# Patient Record
Sex: Male | Born: 1995 | Race: White | Hispanic: No | Marital: Single | Smoking: Never smoker
Health system: Southern US, Community
[De-identification: ages and names within clinical notes are randomized; demographics above are authoritative.]

## PROBLEM LIST (undated history)

## (undated) HISTORY — PX: KNEE SURGERY: SHX244

---
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
## 2014-04-12 IMAGING — CT CT ABD-PELV W/ CM
2 of 4 series · 14 of 32 positions shown, 19 images · IV contrast (READICAT/WATER & [ID] OMNI 300)
Comparison: None.

CLINICAL DATA: Evaluate for appendicitis.  Abdominal pain.

CT ABDOMEN AND PELVIS WITH CONTRAST
TECHNIQUE: Multidetector CT imaging of the abdomen and pelvis was
performed following the standard protocol during bolus
administration of intravenous contrast.
Contrast: 100mL OMNIPAQUE IOHEXOL 300 MG/ML  SOLN

[Series 2: abd/pelvis with · axial · 0.62mm/px · z∈[-404,-99]mm · 7 of 83 slices shown, 12 images]
[im 11/83  soft-tissue]
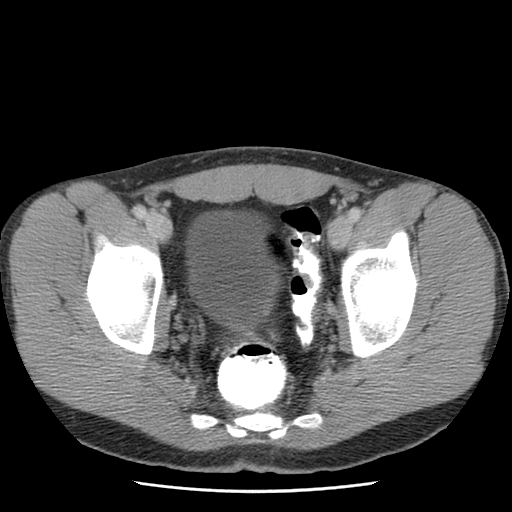
[im 11/83  bone]
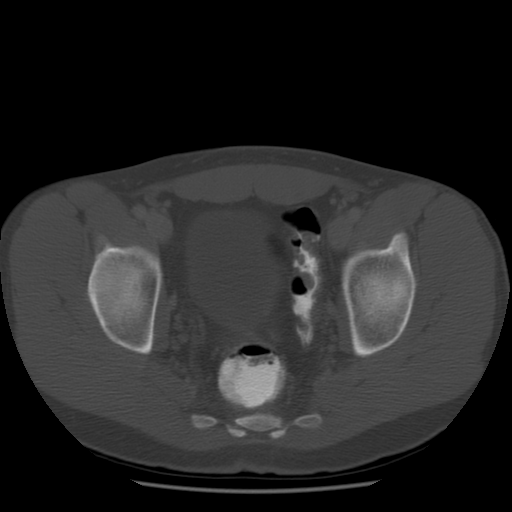
[im 21/83  soft-tissue]
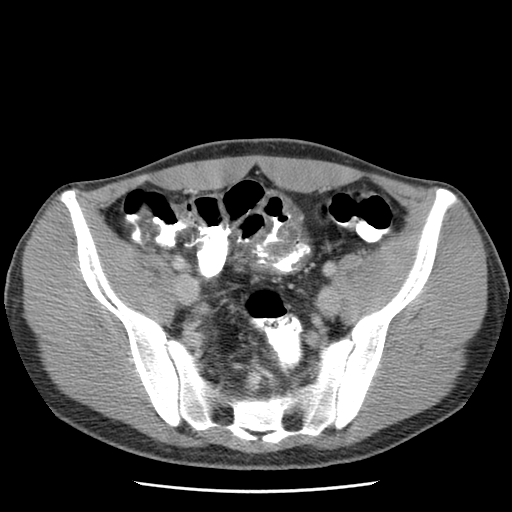
[im 31/83  soft-tissue]
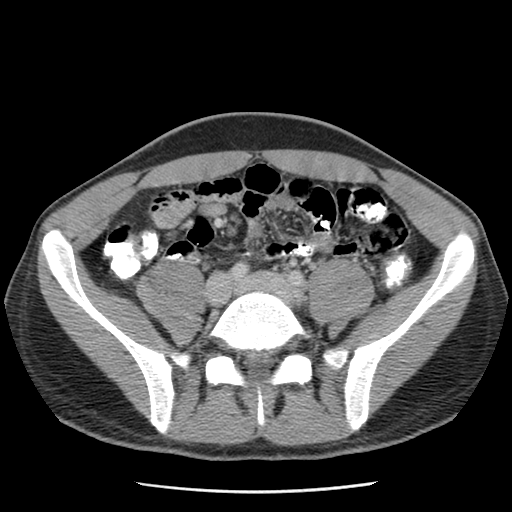
[im 42/83  soft-tissue]
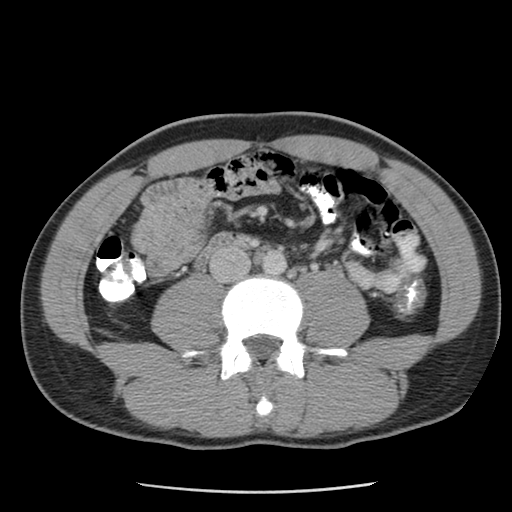
[im 42/83  lung]
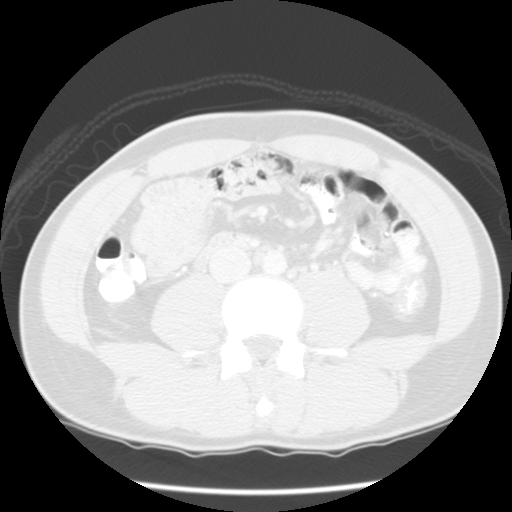
[im 52/83  soft-tissue]
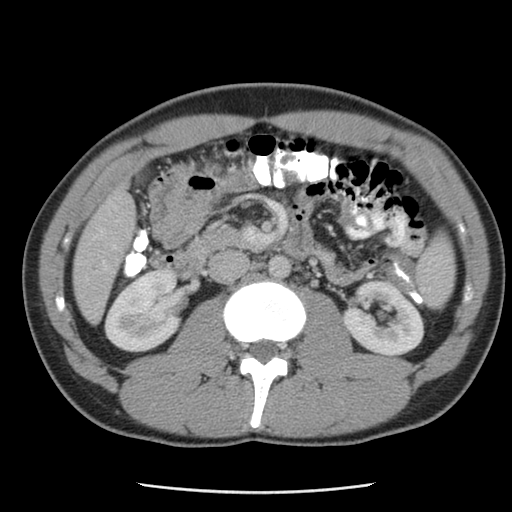
[im 52/83  lung]
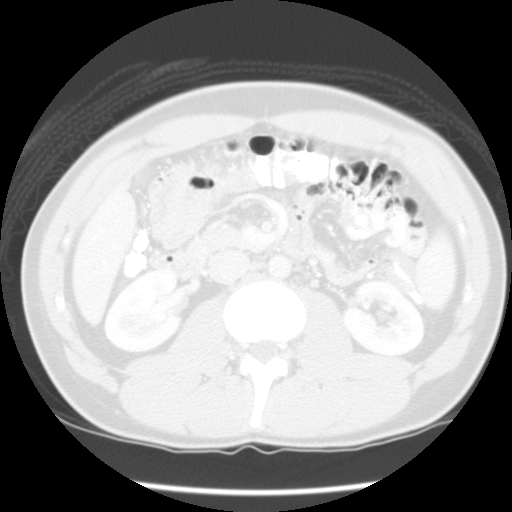
[im 62/83  soft-tissue]
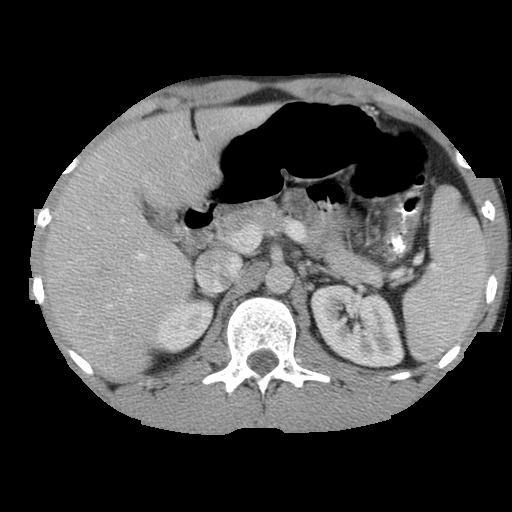
[im 62/83  lung]
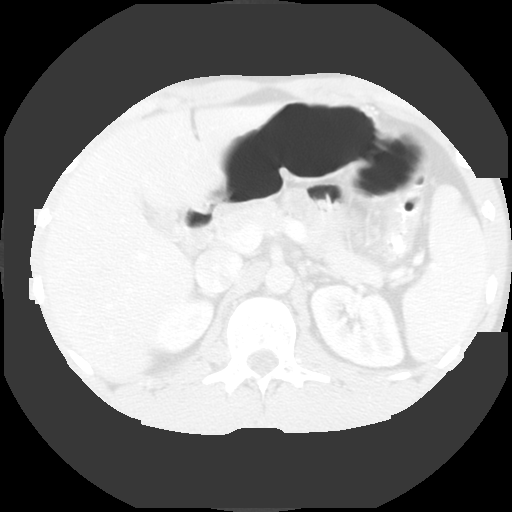
[im 72/83  soft-tissue]
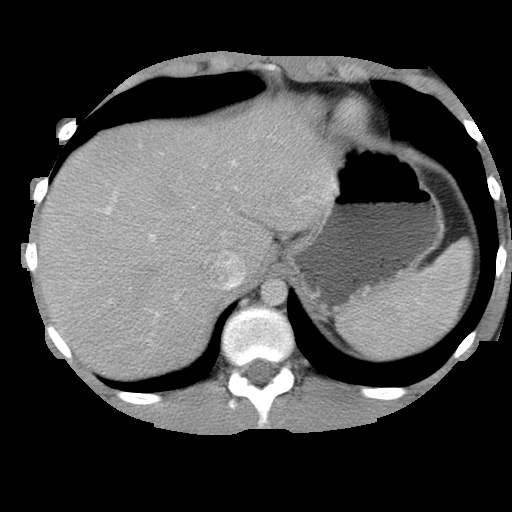
[im 72/83  lung]
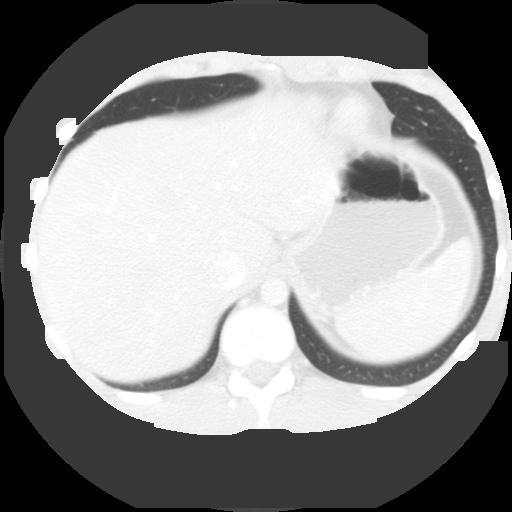

[Series 400: sag · sagittal · 0.86mm/px · 7 of 124 slices shown]
[im 11/124  soft-tissue]
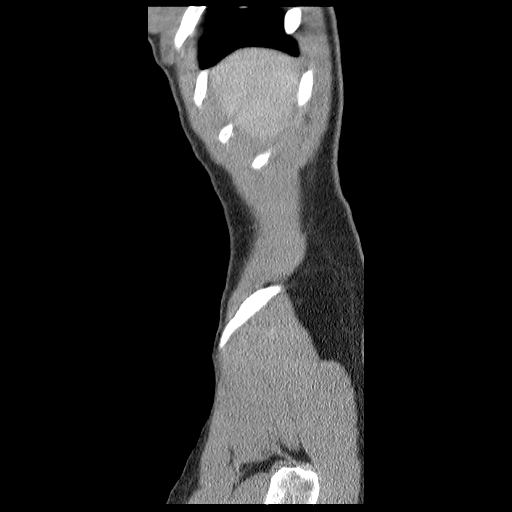
[im 31/124  soft-tissue]
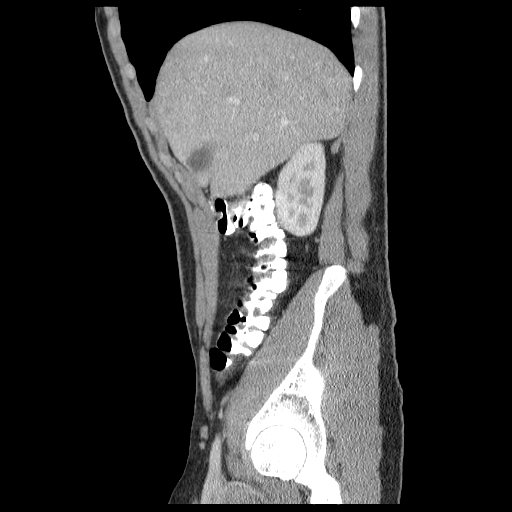
[im 42/124  soft-tissue]
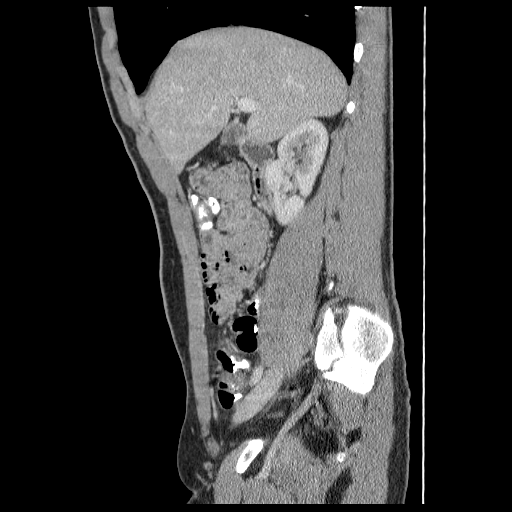
[im 52/124  soft-tissue]
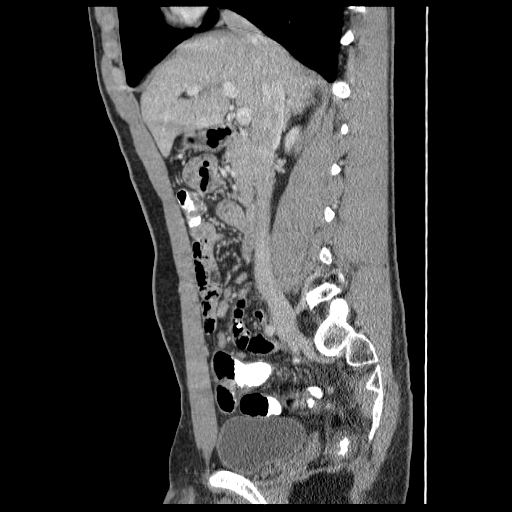
[im 72/124  soft-tissue]
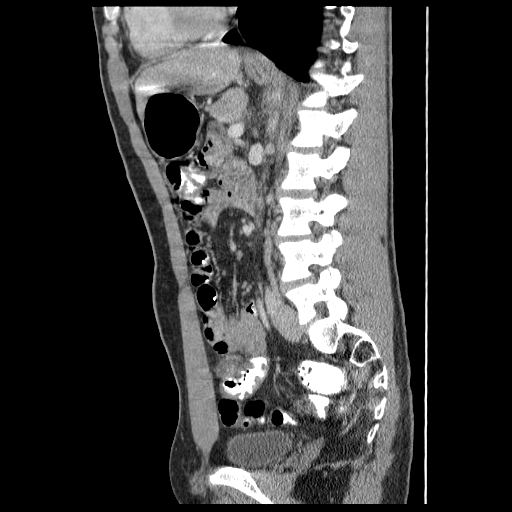
[im 83/124  soft-tissue]
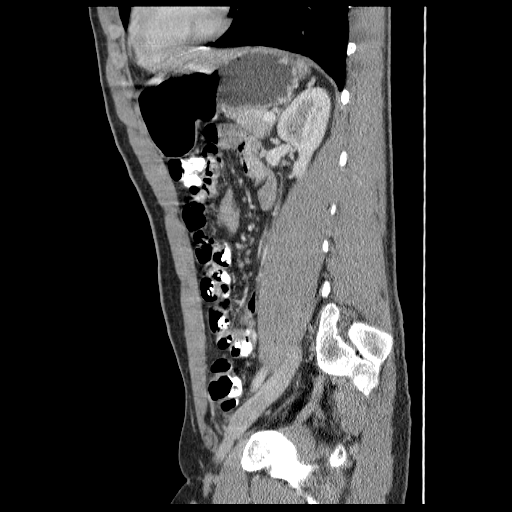
[im 93/124  soft-tissue]
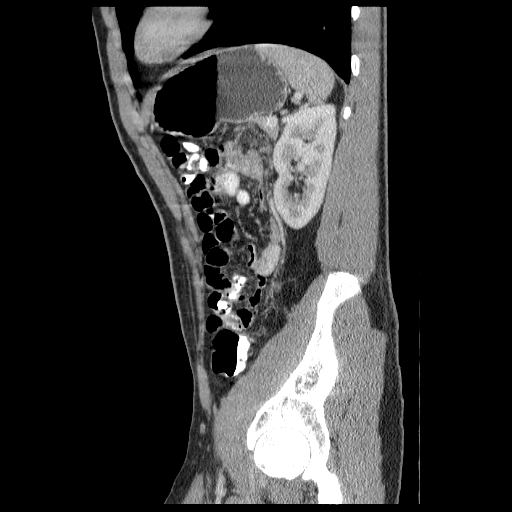

[14 of 32 positions shown; findings below may reference images not displayed]

FINDINGS: The lung bases are clear.  There is no focal liver
abnormalities identified.  The gallbladder is normal.  No biliary
dilatation.  Normal appearance of the pancreas.  The spleen appears
increased in size measuring 13.3 cm.

Normal appearance of the adrenal glands.  The right kidney is
normal.  The left kidney is normal.  No nephrolithiasis or
obstructive uropathy.  Normal appearance of the urinary bladder.
The prostate gland and seminal vesicles appear normal.

Normal caliber of the abdominal aorta.  There are scattered small
retroperitoneal and mesenteric lymph nodes.  No adenopathy.  There
is no pelvic or inguinal adenopathy noted.

The stomach appears normal.  Small bowel loops have a normal
caliber without evidence for obstruction.  There is diffuse colonic
wall thickening identified.  No pneumatosis or bowel perforation
identified.  Mild pericolonic fat stranding noted.  The appendix is
visualized and appears normal.

Review of the visualized osseous structures is unremarkable.
IMPRESSION: 1.  Diffuse colitis.  There are no evidence for pneumatosis, bowel
perforation or abscess formation.
2.  The appendix is visualized and appears normal.
3.  Splenomegaly.

## 2018-06-25 ENCOUNTER — Encounter (HOSPITAL_BASED_OUTPATIENT_CLINIC_OR_DEPARTMENT_OTHER): Payer: Self-pay | Admitting: *Deleted

## 2018-06-25 ENCOUNTER — Emergency Department (HOSPITAL_BASED_OUTPATIENT_CLINIC_OR_DEPARTMENT_OTHER)
Admission: EM | Admit: 2018-06-25 | Discharge: 2018-06-25 | Disposition: A | Discharge status: Home / Self Care | Payer: Managed Care, Other (non HMO) | Attending: Emergency Medicine | Admitting: Emergency Medicine

## 2018-06-25 ENCOUNTER — Other Ambulatory Visit: Payer: Self-pay

## 2018-06-25 DIAGNOSIS — R21 Rash and other nonspecific skin eruption: Secondary | ICD-10-CM | POA: Diagnosis present

## 2018-06-25 DIAGNOSIS — L509 Urticaria, unspecified: Secondary | ICD-10-CM | POA: Insufficient documentation

## 2018-06-25 MED ORDER — PREDNISONE 50 MG PO TABS: 50.0000 mg | ORAL_TABLET | Freq: Every day | ORAL | 0 refills | Status: AC

## 2018-06-25 MED ORDER — DOXEPIN HCL 10 MG PO CAPS: 10.0000 mg | ORAL_CAPSULE | Freq: Three times a day (TID) | ORAL | 0 refills | Status: AC

## 2018-06-25 MED ORDER — PREDNISONE 50 MG PO TABS
60.0000 mg | ORAL_TABLET | Freq: Once | ORAL | Status: AC
  Administered 2018-06-25: 60 mg via ORAL
  Filled 2018-06-25: qty 1

## 2018-06-25 MED ORDER — LORAZEPAM 1 MG PO TABS: 1.0000 mg | ORAL_TABLET | Freq: Every evening | ORAL | 0 refills | Status: AC | PRN

## 2018-06-25 MED ORDER — FAMOTIDINE 20 MG PO TABS
20.0000 mg | ORAL_TABLET | Freq: Once | ORAL | Status: AC
  Administered 2018-06-25: 20 mg via ORAL
  Filled 2018-06-25: qty 1

## 2018-06-25 NOTE — Discharge Instructions (Signed)
Take loratadine (Claritin) or cetirizine (Zyrtec) once a day. Take famotidine (Pepcid) or ranitidine (Zantac) twice a day. Take diphenhydramine (Benadryl) as needed for additional help with itching.

## 2018-06-25 NOTE — ED Provider Notes (Signed)
[facility] EMERGENCY DEPARTMENT Provider Note   CSN: [id number] Arrival date & time: 06/25/18  2057     History   Chief Complaint Chief Complaint  Patient presents with  . Urticaria    HPI Daniel Henson is a 22 y.o. male.  The history is provided by the patient.  He has no significant past history and comes in with a pruritic rash which started last night.  It is across his abdomen and arms and legs.  He has taken diphenhydramine with little improvement.  There is no difficulty breathing or swallowing.  He is just getting over a course of poison ivy.  He can think of no unusual exposures.  He has had hives before when taking penicillin, but no other episodes of hives.  He was unable to sleep tonight because of the itching.  History reviewed. No pertinent past medical history.  There are no active problems to display for this patient.   Past Surgical History:  Procedure Laterality Date  . KNEE SURGERY          Home Medications    Prior to Admission medications   Not on File    Family History No family history on file.  Social History Social History   Tobacco Use  . Smoking status: Never Smoker  . Smokeless tobacco: Never Used  Substance Use Topics  . Alcohol use: Yes  . Drug use: Never     Allergies   Penicillins   Review of Systems Review of Systems  All other systems reviewed and are negative.    Physical Exam Updated Vital Signs BP 132/63   Pulse 79   Temp 98.2 F (36.8 C) (Oral)   Resp 18   Ht 6\' 1"  (1.854 m)   Wt 90.7 kg   SpO2 98%   BMI 26.39 kg/m   Physical Exam  Nursing note and vitals reviewed.  22 year old male, resting comfortably and in no acute distress. Vital signs are normal. Oxygen saturation is 98%, which is normal. Head is normocephalic and atraumatic. PERRLA, EOMI. Oropharynx is clear. Neck is nontender and supple without adenopathy or JVD. Back is nontender and there is no CVA tenderness. Lungs are  clear without rales, wheezes, or rhonchi. Chest is nontender. Heart has regular rate and rhythm without murmur. Abdomen is soft, flat, nontender without masses or hepatosplenomegaly and peristalsis is normoactive. Extremities have no cyanosis or edema, full range of motion is present. Skin is warm and dry.  Urticarial rash is present over the trunk and arms. Neurologic: Mental status is normal, cranial nerves are intact, there are no motor or sensory deficits.  ED Treatments / Results   Procedures Procedures   Medications Ordered in ED Medications  famotidine (PEPCID) tablet 20 mg (20 mg Oral Given 06/25/18 2319)  predniSONE (DELTASONE) tablet 60 mg (60 mg Oral Given 06/25/18 2320)     Initial Impression / Assessment and Plan / ED Course  I have reviewed the triage vital signs and the nursing notes.  Urticaria.  No involvement of mucous membranes or lungs.  Old records are reviewed, and he has no relevant past visits.  He is discharged with prescriptions for doxepin, prednisone, and a small number of lorazepam tablets to help him sleep.  Advised to take over-the-counter second-generation antihistamines as well as H2 blockers.  Final Clinical Impressions(s) / ED Diagnoses   Final diagnoses:  Urticaria    ED Discharge Orders         Ordered  predniSONE (DELTASONE) 50 MG tablet  Daily     06/25/18 2319    doxepin (SINEQUAN) 10 MG capsule  3 times daily     06/25/18 2319    LORazepam (ATIVAN) 1 MG tablet  At bedtime PRN     06/25/18 2319           Dione Booze, MD 06/25/18 2324

## 2018-06-25 NOTE — ED Triage Notes (Signed)
Hives since this am.  He had Benadryl 50 mg 3 hours ago. No respiratory distress.
